# Patient Record
Sex: Male | Born: 1955 | Race: White | Hispanic: No | Marital: Single | Smoking: Never smoker
Health system: Southern US, Community
[De-identification: ages and names within clinical notes are randomized; demographics above are authoritative.]

## PROBLEM LIST (undated history)

## (undated) DIAGNOSIS — I10 Essential (primary) hypertension: Secondary | ICD-10-CM

## (undated) DIAGNOSIS — E7800 Pure hypercholesterolemia, unspecified: Secondary | ICD-10-CM

## (undated) HISTORY — PX: NOSE SURGERY: SHX723

## (undated) HISTORY — PX: SHOULDER SURGERY: SHX246

---

## 2014-08-29 DIAGNOSIS — I10 Essential (primary) hypertension: Secondary | ICD-10-CM | POA: Diagnosis present

## 2014-08-29 DIAGNOSIS — E782 Mixed hyperlipidemia: Secondary | ICD-10-CM | POA: Diagnosis present

## 2018-12-03 DIAGNOSIS — F320 Major depressive disorder, single episode, mild: Secondary | ICD-10-CM | POA: Insufficient documentation

## 2019-07-27 ENCOUNTER — Emergency Department (HOSPITAL_BASED_OUTPATIENT_CLINIC_OR_DEPARTMENT_OTHER)
Admission: EM | Admit: 2019-07-27 | Discharge: 2019-07-27 | Disposition: A | Discharge status: Home / Self Care | Payer: BC Managed Care – PPO | Attending: Emergency Medicine | Admitting: Emergency Medicine

## 2019-07-27 ENCOUNTER — Other Ambulatory Visit: Payer: Self-pay

## 2019-07-27 ENCOUNTER — Encounter (HOSPITAL_BASED_OUTPATIENT_CLINIC_OR_DEPARTMENT_OTHER): Payer: Self-pay | Admitting: *Deleted

## 2019-07-27 DIAGNOSIS — H5711 Ocular pain, right eye: Secondary | ICD-10-CM | POA: Diagnosis present

## 2019-07-27 DIAGNOSIS — H1031 Unspecified acute conjunctivitis, right eye: Secondary | ICD-10-CM | POA: Diagnosis not present

## 2019-07-27 DIAGNOSIS — I10 Essential (primary) hypertension: Secondary | ICD-10-CM | POA: Diagnosis not present

## 2019-07-27 DIAGNOSIS — Z79899 Other long term (current) drug therapy: Secondary | ICD-10-CM | POA: Diagnosis not present

## 2019-07-27 HISTORY — DX: Pure hypercholesterolemia, unspecified: E78.00

## 2019-07-27 HISTORY — DX: Essential (primary) hypertension: I10

## 2019-07-27 MED ORDER — TETRACAINE HCL 0.5 % OP SOLN
2.0000 [drp] | Freq: Once | OPHTHALMIC | Status: AC
  Administered 2019-07-27: 2 [drp] via OPHTHALMIC
  Filled 2019-07-27: qty 4

## 2019-07-27 MED ORDER — POLYMYXIN B-TRIMETHOPRIM 10000-0.1 UNIT/ML-% OP SOLN: 1.0000 [drp] | OPHTHALMIC | 0 refills | Status: AC

## 2019-07-27 MED ORDER — FLUORESCEIN SODIUM 1 MG OP STRP
1.0000 | ORAL_STRIP | Freq: Once | OPHTHALMIC | Status: AC
  Administered 2019-07-27: 1 via OPHTHALMIC
  Filled 2019-07-27: qty 1

## 2019-07-27 NOTE — ED Provider Notes (Signed)
[facility] EMERGENCY DEPARTMENT Provider Note   CSN: [id number] Arrival date & time: 07/27/19  1820     History Chief Complaint  Patient presents with  . Eye Problem    Hayden Combs is a 64 y.o. male past medical history significant for hyperlipidemia, hypertension presents emergency department today with chief complaint of right eye pain x5 days.  Is reporting dull aching pain in his right eye.  He has associated eye redness and clear drainage.  He states his symptoms started x 5 days ago and have progressively worsened.  He rates the pain 4 out of 10 in severity.  He wears contacts but has removed them after symptoms started.  He tried flushing the eye without any symptom relief.  He denies any injury to the eye or foreign body in the eye.  He denies any fever, chills, blurry vision, visual changes, headache, fever, chills, neck pain, nausea, vomiting, photophobia, eye itching.  History provided by patient with additional history obtained from chart review.     Past Medical History:  Diagnosis Date  . High cholesterol   . Hypertension     There are no problems to display for this patient.   Past Surgical History:  Procedure Laterality Date  . NOSE SURGERY    . SHOULDER SURGERY         No family history on file.  Social History   Tobacco Use  . Smoking status: Never Smoker  . Smokeless tobacco: Never Used  Substance Use Topics  . Alcohol use: Not Currently  . Drug use: Never    Home Medications Prior to Admission medications   Medication Sig Start Date End Date Taking? Authorizing Provider  enalapril (VASOTEC) 20 MG tablet Take 20 mg by mouth daily.   Yes [provider]  SIMVASTATIN PO Take by mouth.   Yes [provider]    Allergies    Sulfa antibiotics  Review of Systems   Review of Systems  All other systems are reviewed and are negative for acute change except as noted in the HPI.   Physical Exam Updated Vital  Signs BP (!) 151/80   Pulse 84   Temp 98.3 F (36.8 C) (Oral)   Resp 18   Ht 5\' 3"  (1.6 m)   Wt 59 kg   SpO2 100%   BMI 23.03 kg/m   Physical Exam Vitals and nursing note reviewed.  Constitutional:      General: He is not in acute distress.    Appearance: He is not ill-appearing.  HENT:     Head: Normocephalic and atraumatic.     Right Ear: Tympanic membrane and external ear normal.     Left Ear: Tympanic membrane and external ear normal.     Nose: Nose normal.     Mouth/Throat:     Mouth: Mucous membranes are moist.     Pharynx: Oropharynx is clear.  Eyes:     General: Lids are normal. Lids are everted, no foreign bodies appreciated. No scleral icterus.       Right eye: No discharge (clear).        Left eye: No discharge.     Intraocular pressure: Right eye pressure is 16 mmHg. Left eye pressure is 18 mmHg. Measurements were taken using a handheld tonometer.    Extraocular Movements: Extraocular movements intact.     Conjunctiva/sclera: Conjunctivae normal.     Pupils: Pupils are equal, round, and reactive to light.     Comments: Tetracaine  and Fluorescein applied to both eyes.  Evaluation by Sherral Hammers lamp revealed no evidence of corneal abrasion/fuller seen uptake.  No dendritic lesions.  Negative Seidel sign.   Neck:     Vascular: No JVD.  Cardiovascular:     Rate and Rhythm: Normal rate and regular rhythm.     Pulses: Normal pulses.          Radial pulses are 2+ on the right side and 2+ on the left side.     Heart sounds: Normal heart sounds.  Pulmonary:     Comments: Lungs clear to auscultation in all fields. Symmetric chest rise. No wheezing, rales, or rhonchi. Abdominal:     Comments: Abdomen is soft, non-distended, and non-tender in all quadrants. No rigidity, no guarding. No peritoneal signs.  Musculoskeletal:        General: Normal range of motion.     Cervical back: Normal range of motion.  Skin:    General: Skin is warm and dry.     Capillary Refill:  Capillary refill takes less than 2 seconds.  Neurological:     Mental Status: He is oriented to person, place, and time.     GCS: GCS eye subscore is 4. GCS verbal subscore is 5. GCS motor subscore is 6.     Comments: Fluent speech, no facial droop.  Psychiatric:        Behavior: Behavior normal.     ED Results / Procedures / Treatments   Labs (all labs ordered are listed, but only abnormal results are displayed) Labs Reviewed - No data to display  EKG None  Radiology No results found.  Procedures Procedures (including critical care time)  Medications Ordered in ED Medications - No data to display  ED Course  I have reviewed the triage vital signs and the nursing notes.  Pertinent labs & imaging results that were available during my care of the patient were reviewed by me and considered in my medical decision making (see chart for details).    MDM Rules/Calculators/A&P                       Patient seen and examined.  He is well-appearing, no acute distress.  Vital signs are stable.  On exam he has injected conjunctiva on the right.  Also has clear drainage from the right eye.  Normal IOP's bilaterally.  Pupil and cornea are normal in appearance.  Does not appear cloudy or hazy.  Pupils are equal and reactive to light.  No purulent discharge, corneal abrasions, entrapment, consensual photophobia, or dendritic staining with fluorescein study. Patient presentation consistent with conjunctivitis viral vs bacterial.   Presentation non-concerning for iritis, conjunctivitis, corneal abrasions, or HSV.  Will cover for possible bacterial conjunctivitis with antibiotic drops Personal hygiene and frequent handwashing discussed.  Patient advised to followup with ophthalmologist if symptoms persist or worsen in any way including vision change or purulent discharge.  Patient verbalizes understanding and is agreeable with discharge. The patient appears reasonably screened and/or stabilized  for discharge and I doubt any other medical condition or other [facility] requiring further screening, evaluation, or treatment in the ED at this time prior to discharge. The patient is safe for discharge with strict return precautions discussed.    Portions of this note were generated with [employer]. Dictation errors may occur despite best attempts at proofreading.    Final Clinical Impression(s) / ED Diagnoses Final diagnoses:  None    Rx / DC Orders  ED Discharge Orders    None       Kathyrn Lass 07/27/19 2036    Arby Barrette, MD 08/03/19 [phone]

## 2019-07-27 NOTE — ED Triage Notes (Signed)
Right eye lid pain x 5 days. Pain went away and came back today. Drainage from his eye. He removed his contacts with no improvement.

## 2019-07-27 NOTE — Discharge Instructions (Addendum)
Today you were evaluated at the emergency department for red eye.  Conjunctivitis is very contagious. Try to avoid rubbing eyes. Apply warm compresses and use prescribed eye drops as directed. Follow up with primary care provider in 1 week for further evaluation and to ensure improvement of symptoms. Return to ER for new or worsening symptoms.   Conjunctivitis   Conjunctivitis is commonly called "pink eye." Conjunctivitis can be caused by bacterial or viral infection, allergies, or injuries. There is usually redness of the lining of the eye, itching, discomfort, and sometimes discharge. Pink eye is very contagious and spreads by direct contact.  You may be given antibiotic eyedrops as part of your treatment. Before using your eye medicine, remove all drainage from the eye by washing gently with warm water and cotton balls. Continue to use the medication until you have awakened 2 mornings in a row without discharge from the eye. Do not rub your eye. This increases the irritation and helps spread infection. Use separate towels from other household members. Wash your hands with soap and water before and after touching your eyes. Use cold compresses to reduce pain and sunglasses to relieve irritation from light. Do not wear contact lenses or wear eye makeup until the infection is gone.   SEEK MEDICAL CARE IF:  Your symptoms are not better after 3 days of treatment.  You have increased pain or trouble seeing.  The outer eyelids become very red or swollen.  You develop double vision or your vision becomes blurred or worsens in any way.  You have trouble moving your eyes.  The eye looks like it is popping out (proptosis).  You develop a severe headache, severe neck pain, or neck stiffness.  You develop repeated vomiting.  You have a fever or persistent symptoms for more than 72 hours.  You have a fever and your symptoms suddenly get worse.

## 2020-07-11 ENCOUNTER — Other Ambulatory Visit (HOSPITAL_BASED_OUTPATIENT_CLINIC_OR_DEPARTMENT_OTHER): Payer: Self-pay | Admitting: Internal Medicine

## 2020-07-11 ENCOUNTER — Ambulatory Visit: Payer: BLUE CROSS/BLUE SHIELD | Attending: Internal Medicine

## 2020-07-11 DIAGNOSIS — Z23 Encounter for immunization: Secondary | ICD-10-CM

## 2020-07-11 MED FILL — MODERNA COVID-19 VACCINE 10: 100 | 28 days supply | Qty: 0 | Fill #0

## 2021-07-24 ENCOUNTER — Encounter (HOSPITAL_BASED_OUTPATIENT_CLINIC_OR_DEPARTMENT_OTHER): Payer: Self-pay | Admitting: *Deleted

## 2021-07-24 ENCOUNTER — Emergency Department (HOSPITAL_BASED_OUTPATIENT_CLINIC_OR_DEPARTMENT_OTHER)
Admission: EM | Admit: 2021-07-24 | Discharge: 2021-07-24 | Disposition: A | Discharge status: Home / Self Care | Payer: BLUE CROSS/BLUE SHIELD | Attending: Emergency Medicine | Admitting: Emergency Medicine

## 2021-07-24 ENCOUNTER — Emergency Department (HOSPITAL_BASED_OUTPATIENT_CLINIC_OR_DEPARTMENT_OTHER): Payer: BLUE CROSS/BLUE SHIELD

## 2021-07-24 ENCOUNTER — Other Ambulatory Visit: Payer: Self-pay

## 2021-07-24 DIAGNOSIS — Y9301 Activity, walking, marching and hiking: Secondary | ICD-10-CM | POA: Diagnosis not present

## 2021-07-24 DIAGNOSIS — Z79899 Other long term (current) drug therapy: Secondary | ICD-10-CM | POA: Diagnosis not present

## 2021-07-24 DIAGNOSIS — Z23 Encounter for immunization: Secondary | ICD-10-CM | POA: Diagnosis not present

## 2021-07-24 DIAGNOSIS — S20412A Abrasion of left back wall of thorax, initial encounter: Secondary | ICD-10-CM | POA: Insufficient documentation

## 2021-07-24 DIAGNOSIS — S21442A Puncture wound with foreign body of left back wall of thorax with penetration into thoracic cavity, initial encounter: Secondary | ICD-10-CM | POA: Insufficient documentation

## 2021-07-24 DIAGNOSIS — Y92512 Supermarket, store or market as the place of occurrence of the external cause: Secondary | ICD-10-CM | POA: Diagnosis not present

## 2021-07-24 DIAGNOSIS — T148XXA Other injury of unspecified body region, initial encounter: Secondary | ICD-10-CM

## 2021-07-24 DIAGNOSIS — S41032A Puncture wound without foreign body of left shoulder, initial encounter: Secondary | ICD-10-CM | POA: Diagnosis not present

## 2021-07-24 DIAGNOSIS — M795 Residual foreign body in soft tissue: Secondary | ICD-10-CM

## 2021-07-24 MED ORDER — CEPHALEXIN 500 MG PO CAPS: 500.0000 mg | ORAL_CAPSULE | Freq: Two times a day (BID) | ORAL | 0 refills | Status: DC

## 2021-07-24 MED ORDER — TETANUS-DIPHTH-ACELL PERTUSSIS 5-2.5-18.5 LF-MCG/0.5 IM SUSY
0.5000 mL | PREFILLED_SYRINGE | Freq: Once | INTRAMUSCULAR | Status: AC
  Administered 2021-07-24: 0.5 mL via INTRAMUSCULAR
  Filled 2021-07-24: qty 0.5

## 2021-07-24 MED ORDER — HYDROXYZINE HCL 25 MG PO TABS: 25.0000 mg | ORAL_TABLET | Freq: Four times a day (QID) | ORAL | 0 refills | Status: DC | PRN
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
## 2021-07-24 NOTE — ED Notes (Signed)
Cleaned wound with wound cleaner and sterile gauze

## 2022-04-06 ENCOUNTER — Encounter (HOSPITAL_BASED_OUTPATIENT_CLINIC_OR_DEPARTMENT_OTHER): Payer: Self-pay | Admitting: Emergency Medicine

## 2022-04-06 ENCOUNTER — Inpatient Hospital Stay (HOSPITAL_BASED_OUTPATIENT_CLINIC_OR_DEPARTMENT_OTHER)
Admission: EM | Admit: 2022-04-06 | Discharge: 2022-04-09 | DRG: 684 | Disposition: A | Discharge status: Home / Self Care | Payer: BLUE CROSS/BLUE SHIELD | Attending: Internal Medicine | Admitting: Internal Medicine

## 2022-04-06 ENCOUNTER — Encounter (HOSPITAL_COMMUNITY): Payer: Self-pay

## 2022-04-06 ENCOUNTER — Other Ambulatory Visit: Payer: Self-pay

## 2022-04-06 DIAGNOSIS — E782 Mixed hyperlipidemia: Secondary | ICD-10-CM | POA: Diagnosis not present

## 2022-04-06 DIAGNOSIS — D751 Secondary polycythemia: Secondary | ICD-10-CM | POA: Diagnosis present

## 2022-04-06 DIAGNOSIS — E860 Dehydration: Secondary | ICD-10-CM | POA: Diagnosis not present

## 2022-04-06 DIAGNOSIS — I10 Essential (primary) hypertension: Secondary | ICD-10-CM | POA: Diagnosis not present

## 2022-04-06 DIAGNOSIS — N179 Acute kidney failure, unspecified: Principal | ICD-10-CM | POA: Diagnosis present

## 2022-04-06 DIAGNOSIS — F32A Depression, unspecified: Secondary | ICD-10-CM | POA: Diagnosis not present

## 2022-04-06 DIAGNOSIS — Z79899 Other long term (current) drug therapy: Secondary | ICD-10-CM | POA: Diagnosis not present

## 2022-04-06 DIAGNOSIS — D72829 Elevated white blood cell count, unspecified: Secondary | ICD-10-CM | POA: Diagnosis not present

## 2022-04-06 DIAGNOSIS — K219 Gastro-esophageal reflux disease without esophagitis: Secondary | ICD-10-CM | POA: Diagnosis present

## 2022-04-06 DIAGNOSIS — R197 Diarrhea, unspecified: Secondary | ICD-10-CM

## 2022-04-06 LAB — CBC
HCT: 53 % — ABNORMAL HIGH (ref 39.0–52.0)
Hemoglobin: 17.6 g/dL — ABNORMAL HIGH (ref 13.0–17.0)
MCH: 29.4 pg (ref 26.0–34.0)
MCHC: 33.2 g/dL (ref 30.0–36.0)
MCV: 88.6 fL (ref 80.0–100.0)
Platelets: 294 10*3/uL (ref 150–400)
RBC: 5.98 MIL/uL — ABNORMAL HIGH (ref 4.22–5.81)
RDW: 14.4 % (ref 11.5–15.5)
WBC: 14 10*3/uL — ABNORMAL HIGH (ref 4.0–10.5)
nRBC: 0 % (ref 0.0–0.2)

## 2022-04-06 LAB — COMPREHENSIVE METABOLIC PANEL
ALT: 17 U/L (ref 0–44)
AST: 22 U/L (ref 15–41)
Albumin: 4.9 g/dL (ref 3.5–5.0)
Alkaline Phosphatase: 112 U/L (ref 38–126)
Anion gap: 14 (ref 5–15)
BUN: 47 mg/dL — ABNORMAL HIGH (ref 8–23)
CO2: 13 mmol/L — ABNORMAL LOW (ref 22–32)
Calcium: 9.3 mg/dL (ref 8.9–10.3)
Chloride: 107 mmol/L (ref 98–111)
Creatinine, Ser: 3.06 mg/dL — ABNORMAL HIGH (ref 0.61–1.24)
GFR, Estimated: 22 mL/min — ABNORMAL LOW (ref >60)
Glucose, Bld: 90 mg/dL (ref 70–99)
Potassium: 4.6 mmol/L (ref 3.5–5.1)
Sodium: 134 mmol/L — ABNORMAL LOW (ref 135–145)
Total Bilirubin: 0.9 mg/dL (ref 0.3–1.2)
Total Protein: 8.9 g/dL — ABNORMAL HIGH (ref 6.5–8.1)

## 2022-04-06 LAB — URINALYSIS, ROUTINE W REFLEX MICROSCOPIC
Bilirubin Urine: NEGATIVE
Glucose, UA: NEGATIVE mg/dL
Ketones, ur: NEGATIVE mg/dL
Leukocytes,Ua: NEGATIVE
Nitrite: NEGATIVE
Protein, ur: 100 mg/dL — AB
Specific Gravity, Urine: 1.025 (ref 1.005–1.030)
pH: 5 (ref 5.0–8.0)

## 2022-04-06 LAB — URINALYSIS, MICROSCOPIC (REFLEX)

## 2022-04-06 LAB — LIPASE, BLOOD: Lipase: 44 U/L (ref 11–51)

## 2022-04-06 MED ORDER — SODIUM CHLORIDE 0.9% FLUSH
3.0000 mL | Freq: Two times a day (BID) | INTRAVENOUS | Status: DC
  Administered 2022-04-06 – 2022-04-08 (×2): 3 mL via INTRAVENOUS

## 2022-04-06 MED ORDER — SODIUM CHLORIDE 0.9 % IV BOLUS
1000.0000 mL | Freq: Once | INTRAVENOUS | Status: AC
  Administered 2022-04-06: 1000 mL via INTRAVENOUS

## 2022-04-06 MED ORDER — SODIUM CHLORIDE 0.9 % IV SOLN: INTRAVENOUS | Status: AC

## 2022-04-06 MED ORDER — LOPERAMIDE HCL 2 MG PO CAPS: 4.0000 mg | ORAL_CAPSULE | ORAL | Status: DC | PRN

## 2022-04-06 MED ORDER — ACETAMINOPHEN 650 MG RE SUPP: 650.0000 mg | Freq: Four times a day (QID) | RECTAL | Status: DC | PRN

## 2022-04-06 MED ORDER — ACETAMINOPHEN 325 MG PO TABS: 650.0000 mg | ORAL_TABLET | Freq: Four times a day (QID) | ORAL | Status: DC | PRN

## 2022-04-06 MED ORDER — LACTATED RINGERS IV BOLUS
1000.0000 mL | Freq: Once | INTRAVENOUS | Status: AC
  Administered 2022-04-06: 1000 mL via INTRAVENOUS

## 2022-04-06 MED ORDER — SIMVASTATIN 5 MG PO TABS
5.0000 mg | ORAL_TABLET | Freq: Every day | ORAL | Status: DC
  Administered 2022-04-07 – 2022-04-08 (×2): 5 mg via ORAL
  Filled 2022-04-06 (×3): qty 1

## 2022-04-06 MED ORDER — ENOXAPARIN SODIUM 30 MG/0.3ML IJ SOSY
30.0000 mg | PREFILLED_SYRINGE | INTRAMUSCULAR | Status: DC
  Administered 2022-04-07: 30 mg via SUBCUTANEOUS
  Filled 2022-04-06: qty 0.3

## 2022-04-06 NOTE — ED Triage Notes (Addendum)
Diarrhea x 4 days    no abd pain denies blood  in stool,, denies  taking any OTC meds to help,  no new  meds  he states

## 2022-04-06 NOTE — H&P (Signed)
History and Physical   Hayden Combs OLM:[id number] DOB: 1955-11-01 DOA: 04/06/2022  PCP: Patient, No Pcp Per   Patient coming from: Home  Chief Complaint: Diarrhea  HPI: Hayden Combs is a 66 y.o. male with medical history significant of hypertension, GERD, hyperlipidemia, depression presenting with ongoing diarrhea.  Patient presenting with 4 days of diarrhea described as watery brown as frequent as about every 2 hours.  Denies any abdominal pain, denies any vomiting denies any blood in his stool.  Denies any abnormal food intake.  Denies any sick contacts.  Denies any recent antibiotic use.  Further denies fevers, chills, chest pain, shortness of breath, constipation.  ED Course: Vital signs in the ED significant for heart rate in the 80s to 100s, blood pressure in the 110s to 130s systolic.  Lab work-up included CMP with sodium 134, bicarb 13, BUN 47, creatinine 3.06 up from baseline of 1.01 in 2020, protein 8.9.  CBC with leukocytosis to 14 and erythrocytosis to 17.6.  Lipase normal.  GI pathogen panel pending.  Urinalysis with hemoglobin protein only microscopic analysis with bacteria.  Patient received 2 L IV fluids in the ED.  Admission requested for AKI.  Review of Systems: As per HPI otherwise all other systems reviewed and are negative.  Past Medical History:  Diagnosis Date   High cholesterol    Hypertension     Past Surgical History:  Procedure Laterality Date   NOSE SURGERY     SHOULDER SURGERY      Social History  reports that he has never smoked. He has never used smokeless tobacco. He reports that he does not currently use alcohol. He reports that he does not use drugs.  Allergies  Allergen Reactions   Sulfa Antibiotics     rash    History reviewed. No pertinent family history.   Prior to Admission medications   Medication Sig Start Date End Date Taking? Authorizing Provider  cephALEXin (KEFLEX) 500 MG capsule Take 1 capsule (500 mg total) by mouth 2  (two) times daily. 07/24/21   Achille Rich, PA-C  enalapril (VASOTEC) 20 MG tablet Take 20 mg by mouth daily.    [provider]  hydrOXYzine (ATARAX) 25 MG tablet Take 1 tablet (25 mg total) by mouth every 6 (six) hours as needed. 07/24/21   Achille Rich, PA-C  SIMVASTATIN PO Take by mouth.    [provider]    Physical Exam: Vitals:   04/06/22 1858 04/06/22 2015 04/06/22 2045 04/06/22 2100  BP: 135/82 107/60 127/70 124/75  Pulse: 95 94 (!) 107 (!) 101  Resp: 20 20 20    Temp: 98.1 F (36.7 C) 98.1 F (36.7 C) 98.1 F (36.7 C)   TempSrc:      SpO2: 100% 98% 97% 97%  Weight:      Height:        Physical Exam Constitutional:      General: He is not in acute distress.    Appearance: Normal appearance.  HENT:     Head: Normocephalic and atraumatic.     Mouth/Throat:     Mouth: Mucous membranes are moist.     Pharynx: Oropharynx is clear.  Eyes:     Extraocular Movements: Extraocular movements intact.     Pupils: Pupils are equal, round, and reactive to light.  Cardiovascular:     Rate and Rhythm: Normal rate and regular rhythm.     Pulses: Normal pulses.     Heart sounds: Normal heart sounds.  Pulmonary:  Effort: Pulmonary effort is normal. No respiratory distress.     Breath sounds: Normal breath sounds.  Abdominal:     General: Bowel sounds are normal. There is no distension.     Palpations: Abdomen is soft.     Tenderness: There is no abdominal tenderness.  Musculoskeletal:        General: No swelling or deformity.  Skin:    General: Skin is warm and dry.  Neurological:     General: No focal deficit present.     Mental Status: Mental status is at baseline.    Labs on Admission: I have personally reviewed following labs and imaging studies  CBC: Recent Labs  Lab 04/06/22 1800  WBC 14.0*  HGB 17.6*  HCT 53.0*  MCV 88.6  PLT 294    Basic Metabolic Panel: Recent Labs  Lab 04/06/22 1800  NA 134*  K 4.6  CL 107  CO2 13*   GLUCOSE 90  BUN 47*  CREATININE 3.06*  CALCIUM 9.3    GFR: Estimated Creatinine Clearance: 19.1 mL/min (A) (by C-G formula based on SCr of 3.06 mg/dL (H)).  Liver Function Tests: Recent Labs  Lab 04/06/22 1800  AST 22  ALT 17  ALKPHOS 112  BILITOT 0.9  PROT 8.9*  ALBUMIN 4.9    Urine analysis:    Component Value Date/Time   COLORURINE YELLOW 04/06/2022 2030   APPEARANCEUR CLEAR 04/06/2022 2030   LABSPEC 1.025 04/06/2022 2030   PHURINE 5.0 04/06/2022 2030   GLUCOSEU NEGATIVE 04/06/2022 2030   HGBUR TRACE (A) 04/06/2022 2030   BILIRUBINUR NEGATIVE 04/06/2022 2030   KETONESUR NEGATIVE 04/06/2022 2030   PROTEINUR 100 (A) 04/06/2022 2030   NITRITE NEGATIVE 04/06/2022 2030   LEUKOCYTESUR NEGATIVE 04/06/2022 2030    Radiological Exams on Admission: No results found.  EKG:  Not performed in the emergency department  Assessment/Plan Principal Problem:   AKI (acute kidney injury) (HCC) Active Problems:   Essential hypertension   Mixed hyperlipidemia   AKI > Patient presenting with ongoing diarrhea for the past 4 days as frequently as every 2 hours.  Found to have AKI with creatinine elevated 3.06 with most recent baseline being in 2020 at 1.01. > Patient received 2 L IV fluids in the ED and will continue with further IV fluids overnight. - Monitor on MedSurg - Continue IV fluids overnight - Trend renal function and electrolytes  Diarrhea > As above and per HPI presenting with diarrhea for the past 4 days which is watery and brown in every 2 hours.  No abdominal pain no vomiting no bleeding no sick contacts no abnormal foods, no recent antibiotic use. > Unclear etiology this time.  Does have leukocytosis of this might be partially hemoconcentrated.  GI pathogen panel ordered in ED.  If this is negative may need to consider C. difficile panel. - Continue to monitor on MedSurg - Enteric precautions - IV fluids as above - As needed loperamide - Supportive  care  Hypertension - Hold home enalapril in the setting of AKI  Hyperlipidemia - Continue home simvastatin  DVT prophylaxis: Lovenox Code Status:   Full Family Communication:  None on admission  Disposition Plan:   Patient is from:  Home  Anticipated DC to:  Home  Anticipated DC date:  1 to 2 days  Anticipated DC barriers: None  Consults called:  None Admission status:  Observation, MedSurg  Severity of Illness: The appropriate patient status for this patient is OBSERVATION. Observation status is judged to be  reasonable and necessary in order to provide the required intensity of service to ensure the patient's safety. The patient's presenting symptoms, physical exam findings, and initial radiographic and laboratory data in the context of their medical condition is felt to place them at decreased risk for further clinical deterioration. Furthermore, it is anticipated that the patient will be medically stable for discharge from the hospital within 2 midnights of admission.    Marcelyn Bruins MD [facility]  How to contact the [facility] Attending or Consulting provider [place] or covering provider during after hours [place], for this patient?   Check the care team in [facility] and look for a) attending/consulting [facility] provider listed and b) the [facility] The team listed Log into [email or web address] and use [place]'s universal password to access. If you do not have the password, please contact the hospital operator. Locate the [facility] provider you are looking for under [facility] and page to a number that you can be directly reached. If you still have difficulty reaching the provider, please page the [facility] (Director on Call) for the Hospitalists listed on amion for assistance.  04/06/2022, 10:44 PM

## 2022-04-06 NOTE — ED Notes (Signed)
Assisted patient to the bathroom Patient was a little dizzy  1 person assist

## 2022-04-06 NOTE — ED Notes (Signed)
Tried for labs, hard stick. Gave urine cup.

## 2022-04-06 NOTE — ED Provider Notes (Signed)
  [facility] EMERGENCY DEPARTMENT Provider Note   CSN: [id number] Arrival date & time: 04/06/22  1453     History {Add pertinent medical, surgical, social history, OB history to HPI:1} Chief Complaint  Patient presents with  . Diarrhea    Hayden Combs is a 66 y.o. male who presents with 4 days of diarrhea. He reports watery  brown stool q 2 hours. He denies abdominal pain,, fever , vomiting, melena, hematochezia, hx of IBD. He denies contacts with similar sxs, ingestion of suspect foods or water, history of similar sxs, recent foreign travel . No recent abx use.    Diarrhea      Home Medications Prior to Admission medications   Medication Sig Start Date End Date Taking? Authorizing Provider  cephALEXin (KEFLEX) 500 MG capsule Take 1 capsule (500 mg total) by mouth 2 (two) times daily. 07/24/21   Sherrell Puller, PA-C  enalapril (VASOTEC) 20 MG tablet Take 20 mg by mouth daily.    [provider]  hydrOXYzine (ATARAX) 25 MG tablet Take 1 tablet (25 mg total) by mouth every 6 (six) hours as needed. 07/24/21   Sherrell Puller, PA-C  SIMVASTATIN PO Take by mouth.    [provider]      Allergies    Sulfa antibiotics    Review of Systems   Review of Systems  Gastrointestinal:  Positive for diarrhea.    Physical Exam Updated Vital Signs BP 114/76   Pulse 94   Temp 98.1 F (36.7 C) (Oral)   Resp 20   Ht 5\' 3"  (1.6 m)   Wt 55.3 kg   SpO2 98%   BMI 21.61 kg/m  Physical Exam  ED Results / Procedures / Treatments   Labs (all labs ordered are listed, but only abnormal results are displayed) Labs Reviewed  LIPASE, BLOOD  COMPREHENSIVE METABOLIC PANEL  CBC  URINALYSIS, ROUTINE W REFLEX MICROSCOPIC    EKG None  Radiology No results found.  Procedures Procedures  {Document cardiac monitor, telemetry assessment procedure when appropriate:1}  Medications Ordered in ED Medications  sodium chloride 0.9 % bolus 1,000 mL (1,000 mLs  Intravenous New Bag/Given 04/06/22 1804)    ED Course/ Medical Decision Making/ A&P                           Medical Decision Making Amount and/or Complexity of Data Reviewed Labs: ordered.   ***  {Document critical care time when appropriate:1} {Document review of labs and clinical decision tools ie heart score, Chads2Vasc2 etc:1}  {Document your independent review of radiology images, and any outside records:1} {Document your discussion with family members, caretakers, and with consultants:1} {Document social determinants of health affecting pt's care:1} {Document your decision making why or why not admission, treatments were needed:1} Final Clinical Impression(s) / ED Diagnoses Final diagnoses:  None    Rx / DC Orders ED Discharge Orders     None

## 2022-04-06 NOTE — ED Notes (Signed)
Patient is not able to urinate at this time  

## 2022-04-07 ENCOUNTER — Encounter (HOSPITAL_COMMUNITY): Payer: Self-pay | Admitting: Family Medicine

## 2022-04-07 DIAGNOSIS — N179 Acute kidney failure, unspecified: Secondary | ICD-10-CM | POA: Diagnosis not present

## 2022-04-07 LAB — CBC
HCT: 42.4 % (ref 39.0–52.0)
Hemoglobin: 13.8 g/dL (ref 13.0–17.0)
MCH: 29.6 pg (ref 26.0–34.0)
MCHC: 32.5 g/dL (ref 30.0–36.0)
MCV: 91 fL (ref 80.0–100.0)
Platelets: 246 10*3/uL (ref 150–400)
RBC: 4.66 MIL/uL (ref 4.22–5.81)
RDW: 14.2 % (ref 11.5–15.5)
WBC: 10.1 10*3/uL (ref 4.0–10.5)
nRBC: 0 % (ref 0.0–0.2)

## 2022-04-07 LAB — BASIC METABOLIC PANEL
Anion gap: 7 (ref 5–15)
BUN: 38 mg/dL — ABNORMAL HIGH (ref 8–23)
CO2: 16 mmol/L — ABNORMAL LOW (ref 22–32)
Calcium: 8.1 mg/dL — ABNORMAL LOW (ref 8.9–10.3)
Chloride: 114 mmol/L — ABNORMAL HIGH (ref 98–111)
Creatinine, Ser: 1.64 mg/dL — ABNORMAL HIGH (ref 0.61–1.24)
GFR, Estimated: 46 mL/min — ABNORMAL LOW (ref >60)
Glucose, Bld: 95 mg/dL (ref 70–99)
Potassium: 4.3 mmol/L (ref 3.5–5.1)
Sodium: 137 mmol/L (ref 135–145)

## 2022-04-07 LAB — HIV ANTIBODY (ROUTINE TESTING W REFLEX): HIV Screen 4th Generation wRfx: NONREACTIVE

## 2022-04-07 MED ORDER — SODIUM CHLORIDE 0.9 % IV SOLN: INTRAVENOUS | Status: DC

## 2022-04-07 MED ORDER — ENOXAPARIN SODIUM 40 MG/0.4ML IJ SOSY
40.0000 mg | PREFILLED_SYRINGE | INTRAMUSCULAR | Status: DC
  Administered 2022-04-08 – 2022-04-09 (×2): 40 mg via SUBCUTANEOUS
  Filled 2022-04-07 (×2): qty 0.4

## 2022-04-07 NOTE — Progress Notes (Signed)
PROGRESS NOTE    Hayden Combs  [id number] DOB: 1955-11-19 DOA: 04/06/2022 PCP: Patient, No Pcp Per    Brief Narrative:  Hayden Combs is a 65 y.o. male with medical history significant of hypertension, GERD, hyperlipidemia, depression presenting with ongoing diarrhea.   Patient presenting with 4 days of diarrhea described as watery brown as frequent as about every 2 hours.  Denies any abdominal pain, denies any vomiting denies any blood in his stool.  Denies any abnormal food intake.  Denies any sick contacts.  Denies any recent antibiotic use.     Assessment and Plan: AKI -improving with IVF -baseline around 1   Diarrhea   Does have leukocytosis of this might be partially hemoconcentrated.  GI pathogen panel ordered in ED.  - Supportive care -continues to have diarrhea per patient -advance diet as tolerated   Hypertension - Hold home enalapril in the setting of AKI   Hyperlipidemia - Continue home simvastatin   DVT prophylaxis: enoxaparin (LOVENOX) injection 30 mg Start: 04/07/22 1000    Code Status: Full Code Family Communication:   Disposition Plan:  Level of care: Med-Surg Status is: Observation The patient will require care spanning > 2 midnights and should be moved to inpatient because: needs IVF    Consultants:  none   Subjective: No SOB, no CP, still having diarrhea  Objective: Vitals:   04/07/22 0146 04/07/22 0344 04/07/22 0655 04/07/22 0930  BP: 122/69 139/81  125/75  Pulse: 83 100  84  Resp: 19 18  18   Temp: 97.6 F (36.4 C) 98.3 F (36.8 C)  98.4 F (36.9 C)  TempSrc: Oral Oral  Oral  SpO2: 99% 100%  100%  Weight:   59.4 kg   Height:        Intake/Output Summary (Last 24 hours) at 04/07/2022 1057 Last data filed at 04/07/2022 [id number] Gross per 24 hour  Intake 3592.64 ml  Output 100 ml  Net 3492.64 ml   Filed Weights   04/06/22 1514 04/07/22 0655  Weight: 55.3 kg 59.4 kg    Examination:   General: Appearance:    Well  developed, well nourished male in no acute distress     Lungs:     respirations unlabored  Heart:    Normal heart rate. Normal rhythm. No murmurs, rubs, or gallops.    MS:   All extremities are intact.    Neurologic:   Awake, alert, oriented x 3. No apparent focal neurological           defect.        Data Reviewed: I have personally reviewed following labs and imaging studies  CBC: Recent Labs  Lab 04/06/22 1800 04/07/22 0453  WBC 14.0* 10.1  HGB 17.6* 13.8  HCT 53.0* 42.4  MCV 88.6 91.0  PLT 294 0000000   Basic Metabolic Panel: Recent Labs  Lab 04/06/22 1800 04/07/22 0620  NA 134* 137  K 4.6 4.3  CL 107 114*  CO2 13* 16*  GLUCOSE 90 95  BUN 47* 38*  CREATININE 3.06* 1.64*  CALCIUM 9.3 8.1*   GFR: Estimated Creatinine Clearance: 36.6 mL/min (A) (by C-G formula based on SCr of 1.64 mg/dL (H)). Liver Function Tests: Recent Labs  Lab 04/06/22 1800  AST 22  ALT 17  ALKPHOS 112  BILITOT 0.9  PROT 8.9*  ALBUMIN 4.9   Recent Labs  Lab 04/06/22 1800  LIPASE 44   No results for input(s): "AMMONIA" in the last 168 hours. Coagulation Profile: No results for  input(s): "INR", "PROTIME" in the last 168 hours. Cardiac Enzymes: No results for input(s): "CKTOTAL", "CKMB", "CKMBINDEX", "TROPONINI" in the last 168 hours. BNP (last 3 results) No results for input(s): "PROBNP" in the last 8760 hours. HbA1C: No results for input(s): "HGBA1C" in the last 72 hours. CBG: No results for input(s): "GLUCAP" in the last 168 hours. Lipid Profile: No results for input(s): "CHOL", "HDL", "LDLCALC", "TRIG", "CHOLHDL", "LDLDIRECT" in the last 72 hours. Thyroid Function Tests: No results for input(s): "TSH", "T4TOTAL", "FREET4", "T3FREE", "THYROIDAB" in the last 72 hours. Anemia Panel: No results for input(s): "VITAMINB12", "FOLATE", "FERRITIN", "TIBC", "IRON", "RETICCTPCT" in the last 72 hours. Sepsis Labs: No results for input(s): "PROCALCITON", "LATICACIDVEN" in the last 168  hours.  No results found for this or any previous visit (from the past 240 hour(s)).       Radiology Studies: No results found.      Scheduled Meds:  enoxaparin (LOVENOX) injection  30 mg Subcutaneous Q24H   simvastatin  5 mg Oral q1800   sodium chloride flush  3 mL Intravenous Q12H   Continuous Infusions:  sodium chloride 125 mL/hr at 04/07/22 0832     LOS: 0 days    Time spent: 45 minutes spent on chart review, discussion with nursing staff, consultants, updating family and interview/physical exam; more than 50% of that time was spent in counseling and/or coordination of care.    Geradine Girt, DO [facility] Available via Epic secure chat 7am-7pm After these hours, please refer to coverage provider listed on [email or web address] 04/07/2022, 10:57 AM

## 2022-04-08 DIAGNOSIS — N179 Acute kidney failure, unspecified: Secondary | ICD-10-CM | POA: Diagnosis present

## 2022-04-08 DIAGNOSIS — F32A Depression, unspecified: Secondary | ICD-10-CM | POA: Diagnosis present

## 2022-04-08 DIAGNOSIS — I10 Essential (primary) hypertension: Secondary | ICD-10-CM | POA: Diagnosis present

## 2022-04-08 DIAGNOSIS — D72829 Elevated white blood cell count, unspecified: Secondary | ICD-10-CM | POA: Diagnosis present

## 2022-04-08 DIAGNOSIS — Z79899 Other long term (current) drug therapy: Secondary | ICD-10-CM | POA: Diagnosis not present

## 2022-04-08 DIAGNOSIS — E860 Dehydration: Secondary | ICD-10-CM | POA: Diagnosis present

## 2022-04-08 DIAGNOSIS — D751 Secondary polycythemia: Secondary | ICD-10-CM | POA: Diagnosis present

## 2022-04-08 DIAGNOSIS — E782 Mixed hyperlipidemia: Secondary | ICD-10-CM | POA: Diagnosis present

## 2022-04-08 DIAGNOSIS — K219 Gastro-esophageal reflux disease without esophagitis: Secondary | ICD-10-CM | POA: Diagnosis present

## 2022-04-08 LAB — GASTROINTESTINAL PANEL BY PCR, STOOL (REPLACES STOOL CULTURE)
Adenovirus F40/41: NOT DETECTED
Astrovirus: NOT DETECTED
Campylobacter species: NOT DETECTED
Cryptosporidium: NOT DETECTED
Cyclospora cayetanensis: NOT DETECTED
Entamoeba histolytica: NOT DETECTED
Enteroaggregative E coli (EAEC): NOT DETECTED
Enteropathogenic E coli (EPEC): DETECTED — AB
Enterotoxigenic E coli (ETEC): NOT DETECTED
Giardia lamblia: NOT DETECTED
Norovirus GI/GII: NOT DETECTED
Plesimonas shigelloides: NOT DETECTED
Rotavirus A: NOT DETECTED
Salmonella species: NOT DETECTED
Sapovirus (I, II, IV, and V): DETECTED — AB
Shiga like toxin producing E coli (STEC): NOT DETECTED
Shigella/Enteroinvasive E coli (EIEC): NOT DETECTED
Vibrio cholerae: NOT DETECTED
Vibrio species: NOT DETECTED
Yersinia enterocolitica: NOT DETECTED

## 2022-04-08 LAB — BASIC METABOLIC PANEL
Anion gap: 6 (ref 5–15)
BUN: 21 mg/dL (ref 8–23)
CO2: 15 mmol/L — ABNORMAL LOW (ref 22–32)
Calcium: 8.4 mg/dL — ABNORMAL LOW (ref 8.9–10.3)
Chloride: 118 mmol/L — ABNORMAL HIGH (ref 98–111)
Creatinine, Ser: 0.94 mg/dL (ref 0.61–1.24)
GFR, Estimated: >60 mL/min (ref >60)
Glucose, Bld: 93 mg/dL (ref 70–99)
Potassium: 3.7 mmol/L (ref 3.5–5.1)
Sodium: 139 mmol/L (ref 135–145)

## 2022-04-08 LAB — C DIFFICILE QUICK SCREEN W PCR REFLEX
C Diff antigen: NEGATIVE
C Diff interpretation: NOT DETECTED
C Diff toxin: NEGATIVE

## 2022-04-08 MED ORDER — RISAQUAD PO CAPS
1.0000 | ORAL_CAPSULE | Freq: Every day | ORAL | Status: DC
  Administered 2022-04-08 – 2022-04-09 (×2): 1 via ORAL
  Filled 2022-04-08 (×2): qty 1

## 2022-04-08 NOTE — Progress Notes (Signed)
PROGRESS NOTE    Hayden Combs  YOV:[id number] DOB: 1955/09/15 DOA: 04/06/2022 PCP: Patient, No Pcp Per    Brief Narrative:  Hayden Combs is a 66 y.o. male with medical history significant of hypertension, GERD, hyperlipidemia, depression presenting with ongoing diarrhea.   Patient presenting with 4 days of diarrhea described as watery brown as frequent as about every 2 hours.  Continue to have diarrhea and requiring IVF to stay hydrated.  Await GI pathogen panel and c diff.       Assessment and Plan: AKI -improving with IVF -baseline around 1 -still needing IVF as still having diarrhea   Diarrhea   Does have leukocytosis of this might be partially hemoconcentrated.  GI pathogen panel ordered in ED.  - Supportive care -continues to have diarrhea per patient -c diff pending as well   Hypertension - Hold home enalapril in the setting of AKI   Hyperlipidemia - Continue home simvastatin   DVT prophylaxis: enoxaparin (LOVENOX) injection 40 mg Start: 04/08/22 1000    Code Status: Full Code Family Communication:   Disposition Plan:  Level of care: Med-Surg Status is: inpt    Consultants:  none   Subjective: Still having diarrhea  Objective: Vitals:   04/07/22 0930 04/07/22 1145 04/07/22 2124 04/08/22 0525  BP: 125/75 (!) 141/72 132/82 138/72  Pulse: 84 77 92 84  Resp: 18 18 18 18   Temp: 98.4 F (36.9 C) 98.5 F (36.9 C) 98.8 F (37.1 C) 98 F (36.7 C)  TempSrc: Oral Oral Oral Oral  SpO2: 100% 100% 98% 99%  Weight:      Height:        Intake/Output Summary (Last 24 hours) at 04/08/2022 1145 Last data filed at 04/08/2022 1102 Gross per 24 hour  Intake 1597.61 ml  Output 0 ml  Net 1597.61 ml   Filed Weights   04/06/22 1514 04/07/22 0655  Weight: 55.3 kg 59.4 kg    Examination:   General: Appearance:    Well developed, well nourished male in no acute distress     Lungs:     respirations unlabored  Heart:    Normal heart rate.   MS:   All  extremities are intact.   Neurologic:   Awake, alert       Data Reviewed: I have personally reviewed following labs and imaging studies  CBC: Recent Labs  Lab 04/06/22 1800 04/07/22 0453  WBC 14.0* 10.1  HGB 17.6* 13.8  HCT 53.0* 42.4  MCV 88.6 91.0  PLT 294 246   Basic Metabolic Panel: Recent Labs  Lab 04/06/22 1800 04/07/22 0620 04/08/22 0420  NA 134* 137 139  K 4.6 4.3 3.7  CL 107 114* 118*  CO2 13* 16* 15*  GLUCOSE 90 95 93  BUN 47* 38* 21  CREATININE 3.06* 1.64* 0.94  CALCIUM 9.3 8.1* 8.4*   GFR: Estimated Creatinine Clearance: 63.9 mL/min (by C-G formula based on SCr of 0.94 mg/dL). Liver Function Tests: Recent Labs  Lab 04/06/22 1800  AST 22  ALT 17  ALKPHOS 112  BILITOT 0.9  PROT 8.9*  ALBUMIN 4.9   Recent Labs  Lab 04/06/22 1800  LIPASE 44   No results for input(s): "AMMONIA" in the last 168 hours. Coagulation Profile: No results for input(s): "INR", "PROTIME" in the last 168 hours. Cardiac Enzymes: No results for input(s): "CKTOTAL", "CKMB", "CKMBINDEX", "TROPONINI" in the last 168 hours. BNP (last 3 results) No results for input(s): "PROBNP" in the last 8760 hours. HbA1C: No results for  input(s): "HGBA1C" in the last 72 hours. CBG: No results for input(s): "GLUCAP" in the last 168 hours. Lipid Profile: No results for input(s): "CHOL", "HDL", "LDLCALC", "TRIG", "CHOLHDL", "LDLDIRECT" in the last 72 hours. Thyroid Function Tests: No results for input(s): "TSH", "T4TOTAL", "FREET4", "T3FREE", "THYROIDAB" in the last 72 hours. Anemia Panel: No results for input(s): "VITAMINB12", "FOLATE", "FERRITIN", "TIBC", "IRON", "RETICCTPCT" in the last 72 hours. Sepsis Labs: No results for input(s): "PROCALCITON", "LATICACIDVEN" in the last 168 hours.  No results found for this or any previous visit (from the past 240 hour(s)).       Radiology Studies: No results found.      Scheduled Meds:  enoxaparin (LOVENOX) injection  40 mg  Subcutaneous Q24H   simvastatin  5 mg Oral q1800   sodium chloride flush  3 mL Intravenous Q12H   Continuous Infusions:  sodium chloride 75 mL/hr at 04/08/22 0246     LOS: 0 days    Time spent: 45 minutes spent on chart review, discussion with nursing staff, consultants, updating family and interview/physical exam; more than 50% of that time was spent in counseling and/or coordination of care.    Geradine Girt, DO [facility] Available via Epic secure chat 7am-7pm After these hours, please refer to coverage provider listed on [email or web address] 04/08/2022, 11:45 AM

## 2022-04-09 DIAGNOSIS — N179 Acute kidney failure, unspecified: Secondary | ICD-10-CM | POA: Diagnosis not present

## 2022-04-09 LAB — CBC
HCT: 43.5 % (ref 39.0–52.0)
Hemoglobin: 14.6 g/dL (ref 13.0–17.0)
MCH: 29.5 pg (ref 26.0–34.0)
MCHC: 33.6 g/dL (ref 30.0–36.0)
MCV: 87.9 fL (ref 80.0–100.0)
Platelets: 287 10*3/uL (ref 150–400)
RBC: 4.95 MIL/uL (ref 4.22–5.81)
RDW: 14.1 % (ref 11.5–15.5)
WBC: 9.1 10*3/uL (ref 4.0–10.5)
nRBC: 0 % (ref 0.0–0.2)

## 2022-04-09 LAB — BASIC METABOLIC PANEL
Anion gap: 4 — ABNORMAL LOW (ref 5–15)
BUN: 11 mg/dL (ref 8–23)
CO2: 21 mmol/L — ABNORMAL LOW (ref 22–32)
Calcium: 8.7 mg/dL — ABNORMAL LOW (ref 8.9–10.3)
Chloride: 110 mmol/L (ref 98–111)
Creatinine, Ser: 0.95 mg/dL (ref 0.61–1.24)
GFR, Estimated: >60 mL/min (ref >60)
Glucose, Bld: 105 mg/dL — ABNORMAL HIGH (ref 70–99)
Potassium: 3.7 mmol/L (ref 3.5–5.1)
Sodium: 135 mmol/L (ref 135–145)

## 2022-04-09 MED ORDER — LOPERAMIDE HCL 2 MG PO CAPS: 4.0000 mg | ORAL_CAPSULE | ORAL | 0 refills | Status: AC | PRN

## 2022-04-09 MED ORDER — RISAQUAD PO CAPS: 1.0000 | ORAL_CAPSULE | Freq: Every day | ORAL | 0 refills | Status: AC

## 2022-04-09 NOTE — Progress Notes (Signed)
Patient was given discharge instructions and doctor's note for work, and all questions were answered. Patient was stable for discharge and was walked to the main exit.

## 2022-04-09 NOTE — Discharge Summary (Signed)
Physician Discharge Summary  Hayden Combs [id number] DOB: 05-02-1956 DOA: 04/06/2022  PCP: Hayden Elliot, PA-C  Admit date: 04/06/2022 Discharge date: 04/09/2022  Admitted From: home Discharge disposition: home   Recommendations for Outpatient Follow-Up:   GI pathogen panel: EPEC and sapovirus Improved with supportive care   Discharge Diagnosis:   Principal Problem:   AKI (acute kidney injury) ([place]) Active Problems:   Essential hypertension   Mixed hyperlipidemia    Discharge Condition: Improved.  Diet recommendation: soft  Wound care: None.  Code status: Full.   History of Present Illness:   Hayden Combs is a 66 y.o. male with medical history significant of hypertension, GERD, hyperlipidemia, depression presenting with ongoing diarrhea.   Patient presenting with 4 days of diarrhea described as watery brown as frequent as about every 2 hours.  Denies any abdominal pain, denies any vomiting denies any blood in his stool.  Denies any abnormal food intake.  Denies any sick contacts.  Denies any recent antibiotic use.   Further denies fevers, chills, chest pain, shortness of breath, constipation   Hospital Course by Problem:   AKI -improving with IVF -baseline around 1 -improved   Diarrhea   GI pathogen panel shows the above viruses -c diff negative   Hypertension - resume home meds       Medical Consultants:      Discharge Exam:   Vitals:   04/08/22 2112 04/09/22 0532  BP: (!) 140/76 122/66  Pulse: 83 65  Resp: 17 18  Temp: 99.1 F (37.3 C) 98.5 F (36.9 C)  SpO2: 100% 100%   Vitals:   04/08/22 0525 04/08/22 2112 04/09/22 0500 04/09/22 0532  BP: 138/72 (!) 140/76  122/66  Pulse: 84 83  65  Resp: 18 17  18   Temp: 98 F (36.7 C) 99.1 F (37.3 C)  98.5 F (36.9 C)  TempSrc: Oral Oral  Oral  SpO2: 99% 100%  100%  Weight:   56.2 kg   Height:        General exam: Appears calm and comfortable. Walking in the  hallways   The results of significant diagnostics from this hospitalization (including imaging, microbiology, ancillary and laboratory) are listed below for reference.     Procedures and Diagnostic Studies:   No results found.   Labs:   Basic Metabolic Panel: Recent Labs  Lab 04/06/22 1800 04/07/22 0620 04/08/22 0420  NA 134* 137 139  K 4.6 4.3 3.7  CL 107 114* 118*  CO2 13* 16* 15*  GLUCOSE 90 95 93  BUN 47* 38* 21  CREATININE 3.06* 1.64* 0.94  CALCIUM 9.3 8.1* 8.4*   GFR Estimated Creatinine Clearance: 63.1 mL/min (by C-G formula based on SCr of 0.94 mg/dL). Liver Function Tests: Recent Labs  Lab 04/06/22 1800  AST 22  ALT 17  ALKPHOS 112  BILITOT 0.9  PROT 8.9*  ALBUMIN 4.9   Recent Labs  Lab 04/06/22 1800  LIPASE 44   No results for input(s): "AMMONIA" in the last 168 hours. Coagulation profile No results for input(s): "INR", "PROTIME" in the last 168 hours.  CBC: Recent Labs  Lab 04/06/22 1800 04/07/22 0453  WBC 14.0* 10.1  HGB 17.6* 13.8  HCT 53.0* 42.4  MCV 88.6 91.0  PLT 294 246   Cardiac Enzymes: No results for input(s): "CKTOTAL", "CKMB", "CKMBINDEX", "TROPONINI" in the last 168 hours. BNP: Invalid input(s): "POCBNP" CBG: No results for input(s): "GLUCAP" in the last 168 hours. D-Dimer No results for input(s): "  DDIMER" in the last 72 hours. Hgb A1c No results for input(s): "HGBA1C" in the last 72 hours. Lipid Profile No results for input(s): "CHOL", "HDL", "LDLCALC", "TRIG", "CHOLHDL", "LDLDIRECT" in the last 72 hours. Thyroid function studies No results for input(s): "TSH", "T4TOTAL", "T3FREE", "THYROIDAB" in the last 72 hours.  Invalid input(s): "FREET3" Anemia work up No results for input(s): "VITAMINB12", "FOLATE", "FERRITIN", "TIBC", "IRON", "RETICCTPCT" in the last 72 hours. Microbiology Recent Results (from the past 240 hour(s))  Gastrointestinal Panel by PCR , Stool     Status: Abnormal   Collection Time: 04/07/22   4:01 AM   Specimen: Rectum; Stool  Result Value Ref Range Status   Campylobacter species NOT DETECTED NOT DETECTED Final   Plesimonas shigelloides NOT DETECTED NOT DETECTED Final   Salmonella species NOT DETECTED NOT DETECTED Final   Yersinia enterocolitica NOT DETECTED NOT DETECTED Final   Vibrio species NOT DETECTED NOT DETECTED Final   Vibrio cholerae NOT DETECTED NOT DETECTED Final   Enteroaggregative E coli (EAEC) NOT DETECTED NOT DETECTED Final   Enteropathogenic E coli (EPEC) DETECTED (A) NOT DETECTED Final    Comment: RESULT CALLED TO, READ BACK BY AND VERIFIED WITH: BISHNU PAUDEU AT 2137 ON 04/08/22 BY SS    Enterotoxigenic E coli (ETEC) NOT DETECTED NOT DETECTED Final   Shiga like toxin producing E coli (STEC) NOT DETECTED NOT DETECTED Final   Shigella/Enteroinvasive E coli (EIEC) NOT DETECTED NOT DETECTED Final   Cryptosporidium NOT DETECTED NOT DETECTED Final   Cyclospora cayetanensis NOT DETECTED NOT DETECTED Final   Entamoeba histolytica NOT DETECTED NOT DETECTED Final   Giardia lamblia NOT DETECTED NOT DETECTED Final   Adenovirus F40/41 NOT DETECTED NOT DETECTED Final   Astrovirus NOT DETECTED NOT DETECTED Final   Norovirus GI/GII NOT DETECTED NOT DETECTED Final   Rotavirus A NOT DETECTED NOT DETECTED Final   Sapovirus (I, II, IV, and V) DETECTED (A) NOT DETECTED Final    Comment: Performed at [facility], [address] [place], [place] [address]  C Difficile Quick Screen w PCR reflex     Status: None   Collection Time: 04/08/22  9:28 AM   Specimen: STOOL  Result Value Ref Range Status   C Diff antigen NEGATIVE NEGATIVE Final   C Diff toxin NEGATIVE NEGATIVE Final   C Diff interpretation No C. difficile detected.  Final    Comment: Performed at [facility], [address] [place], [place] [address]     Discharge Instructions:   Discharge Instructions     Discharge instructions   Complete by: As directed    Soft/low residue  foods-- advance back to normal diet when bowel return to normal   Increase activity slowly   Complete by: As directed       Allergies as of 04/09/2022       Reactions   Sulfa Antibiotics Rash        Medication List     STOP taking these medications    cephALEXin 500 MG capsule Commonly known as: KEFLEX   hydrOXYzine 25 MG tablet Commonly known as: ATARAX   SIMVASTATIN PO       TAKE these medications    acidophilus Caps capsule Take 1 capsule by mouth daily.   Aleve 220 MG tablet Generic drug: naproxen sodium Take 220-440 mg by mouth 2 (two) times daily as needed (for mild pain).   atorvastatin 40 MG tablet Commonly known as: LIPITOR Take 40 mg by mouth daily.  enalapril 20 MG tablet Commonly known as: VASOTEC Take 20 mg by mouth daily.   loperamide 2 MG capsule Commonly known as: IMODIUM Take 2 capsules (4 mg total) by mouth as needed for diarrhea or loose stools.        Follow-up Information     Hayden Elliot, PA-C Follow up in 1 week(s).   Specialty: Family Medicine Contact information: [address] [place] [address] [phone]                  Time coordinating discharge: 3  Signed:  Geradine Girt DO  [facility] 04/09/2022, 8:54 AM

## 2022-04-09 NOTE — TOC CM/SW Note (Signed)
  Transition of Care [facility]) Screening Note   Patient Details  Name: Hayden Combs Date of Birth: [birth year]   Transition of Care [facility]) CM/SW Contact:    Ross Ludwig, LCSW Phone Number: 04/09/2022, 10:20 AM    Transition of Care Department [facility]) has reviewed patient and no TOC needs have been identified at this time. We will continue to monitor patient advancement through interdisciplinary progression rounds. If new patient transition needs arise, please place a TOC consult.
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
